# Patient Record
Sex: Male | Born: 2000 | Race: Black or African American | Hispanic: No | Marital: Single | Smoking: Never smoker
Health system: Southern US, Community
[De-identification: ages and names within clinical notes are randomized; demographics above are authoritative.]

---
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
## 2021-08-23 ENCOUNTER — Encounter (HOSPITAL_COMMUNITY): Payer: Self-pay

## 2021-08-23 ENCOUNTER — Ambulatory Visit (HOSPITAL_COMMUNITY)
Admission: EM | Admit: 2021-08-23 | Discharge: 2021-08-23 | Disposition: A | Discharge status: Home / Self Care | Payer: No Typology Code available for payment source | Attending: Student | Admitting: Student

## 2021-08-23 ENCOUNTER — Other Ambulatory Visit: Payer: Self-pay

## 2021-08-23 DIAGNOSIS — Z202 Contact with and (suspected) exposure to infections with a predominantly sexual mode of transmission: Secondary | ICD-10-CM | POA: Insufficient documentation

## 2021-08-23 DIAGNOSIS — Z113 Encounter for screening for infections with a predominantly sexual mode of transmission: Secondary | ICD-10-CM | POA: Diagnosis present

## 2021-08-23 MED ORDER — DOXYCYCLINE HYCLATE 100 MG PO CAPS: 100.0000 mg | ORAL_CAPSULE | Freq: Two times a day (BID) | ORAL | 0 refills | Status: AC

## 2021-08-23 NOTE — ED Triage Notes (Signed)
Pt presents for STD testing.   States he had a partner that tested positive for Chlamydia. States he has discharge and burning when urinating.

## 2021-08-23 NOTE — Discharge Instructions (Addendum)
-  Doxycycline twice daily for 7 days.  Make sure to wear sunscreen while spending time outside while on this medication as it can increase your chance of sunburn. You can take this medication with food if you have a sensitive stomach.  

## 2021-08-23 NOTE — ED Provider Notes (Signed)
That his [facility]    CSN: [id number] Arrival date & time: 08/23/21  [id number]      History   Chief Complaint Chief Complaint  Patient presents with   SEXUALLY TRANSMITTED DISEASE    HPI Lee Castro is a 20 y.o. male presenting with penile discharge following exposure to chlamydia.  Medical history noncontributory.  His male partner was recently told that her old partner had chlamydia, she got tested and she also has chlamydia.  Patient has been having unprotected sex with partner.  Describes white penile discharge and dysuria.  Denies penile or testicular pain or swelling, penile or testicular lesions, abdominal pain, flank pain, fever/chills.  HPI  History reviewed. No pertinent past medical history.  There are no problems to display for this patient.   History reviewed. No pertinent surgical history.     Home Medications    Prior to Admission medications   Medication Sig Start Date End Date Taking? Authorizing Provider  doxycycline (VIBRAMYCIN) 100 MG capsule Take 1 capsule (100 mg total) by mouth 2 (two) times daily for 7 days. 08/23/21 08/30/21 Yes Hazel Sams, PA-C  cephALEXin (KEFLEX) 500 MG capsule Take 1 capsule (500 mg total) by mouth 3 (three) times daily. For 10 days 03/17/17   Harlene Salts, MD  ibuprofen (ADVIL) 200 MG tablet Take by mouth.    [provider]  mupirocin ointment (BACTROBAN) 2 % Apply to affected rash bid for 10 days 03/17/17   Harlene Salts, MD  ondansetron (ZOFRAN) 4 MG tablet Take 1 tablet (4 mg total) by mouth every 6 (six) hours. 12/10/16   Hedges, Dellis Filbert, PA-C  trimethoprim-polymyxin b (POLYTRIM) ophthalmic solution Place 1 drop into the left eye every 6 (six) hours. For 5 days 03/17/17   Harlene Salts, MD    Family History Family History  Problem Relation Age of Onset   Healthy Mother    Healthy Father     Social History Social History   Tobacco Use   Smoking status: Never   Smokeless tobacco: Never   Substance Use Topics   Alcohol use: No   Drug use: No     Allergies   Other   Review of Systems Review of Systems  Constitutional:  Negative for chills and fever.  HENT:  Negative for sore throat.   Eyes:  Negative for pain and redness.  Respiratory:  Negative for shortness of breath.   Cardiovascular:  Negative for chest pain.  Gastrointestinal:  Negative for abdominal pain, diarrhea, nausea and vomiting.  Genitourinary:  Positive for dysuria and penile discharge. Negative for decreased urine volume, difficulty urinating, flank pain, frequency, genital sores, hematuria, penile pain, penile swelling, scrotal swelling, testicular pain and urgency.  Musculoskeletal:  Negative for back pain.  Skin:  Negative for rash.  All other systems reviewed and are negative.   Physical Exam Triage Vital Signs ED Triage Vitals  Enc Vitals Group     BP 08/23/21 0945 120/75     Pulse Rate 08/23/21 0944 (!) 57     Resp 08/23/21 0944 20     Temp 08/23/21 0944 98 F (36.7 C)     Temp Source 08/23/21 0944 Oral     SpO2 08/23/21 0944 99 %     Weight --      Height --      Head Circumference --      Peak Flow --      Pain Score 08/23/21 0942 0     Pain  Loc --      Pain Edu? --      Excl. in [place]? --    No data found.  Updated Vital Signs BP 120/75 (BP Location: Right Arm)   Pulse (!) 57   Temp 98 F (36.7 C) (Oral)   Resp 20   SpO2 99%   Visual Acuity Right Eye Distance:   Left Eye Distance:   Bilateral Distance:    Right Eye Near:   Left Eye Near:    Bilateral Near:     Physical Exam Vitals reviewed.  Constitutional:      General: He is not in acute distress.    Appearance: Normal appearance. He is not ill-appearing.  HENT:     Head: Normocephalic and atraumatic.     Mouth/Throat:     Mouth: Mucous membranes are moist.     Comments: Moist mucous membranes Eyes:     Extraocular Movements: Extraocular movements intact.     Pupils: Pupils are equal, round, and  reactive to light.  Cardiovascular:     Rate and Rhythm: Normal rate and regular rhythm.     Heart sounds: Normal heart sounds.  Pulmonary:     Effort: Pulmonary effort is normal.     Breath sounds: Normal breath sounds. No wheezing, rhonchi or rales.  Abdominal:     General: Bowel sounds are normal. There is no distension.     Palpations: Abdomen is soft. There is no mass.     Tenderness: There is no abdominal tenderness. There is no right CVA tenderness, left CVA tenderness, guarding or rebound.  Genitourinary:    Comments: deferred Skin:    General: Skin is warm.     Capillary Refill: Capillary refill takes less than 2 seconds.     Comments: Good skin turgor  Neurological:     General: No focal deficit present.     Mental Status: He is alert and oriented to person, place, and time.  Psychiatric:        Mood and Affect: Mood normal.        Behavior: Behavior normal.     UC Treatments / Results  Labs (all labs ordered are listed, but only abnormal results are displayed) Labs Reviewed  CYTOLOGY, (ORAL, ANAL, URETHRAL) ANCILLARY ONLY    EKG   Radiology No results found.  Procedures Procedures (including critical care time)  Medications Ordered in UC Medications - No data to display  Initial Impression / Assessment and Plan / UC Course  I have reviewed the triage vital signs and the nursing notes.  Pertinent labs & imaging results that were available during my care of the patient were reviewed by me and considered in my medical decision making (see chart for details).     This patient is a very pleasant 20 y.o. year old male presenting with penile discharge following exposure to chlamydia. Afebrile, nontachycardic, no reproducible abd pain or CVAT.  Will send self-swab for G/C, trich. Declines HIV, RPR. Safe sex precautions.   Doxycycline sent.   ED return precautions discussed. Patient verbalizes understanding and agreement.     Final Clinical  Impressions(s) / UC Diagnoses   Final diagnoses:  Exposure to chlamydia  Routine screening for STI (sexually transmitted infection)     Discharge Instructions      -Doxycycline twice daily for 7 days.  Make sure to wear sunscreen while spending time outside while on this medication as it can increase your chance of sunburn. You can take this medication  with food if you have a sensitive stomach.    ED Prescriptions     Medication Sig Dispense Auth. Provider   doxycycline (VIBRAMYCIN) 100 MG capsule Take 1 capsule (100 mg total) by mouth 2 (two) times daily for 7 days. 14 capsule Hazel Sams, PA-C      PDMP not reviewed this encounter.   Hazel Sams, PA-C 08/23/21 1028

## 2021-08-26 LAB — CYTOLOGY, (ORAL, ANAL, URETHRAL) ANCILLARY ONLY
Chlamydia: POSITIVE — AB
Comment: NEGATIVE
Comment: NEGATIVE
Comment: NORMAL
Neisseria Gonorrhea: NEGATIVE
Trichomonas: POSITIVE — AB

## 2021-08-27 ENCOUNTER — Telehealth (HOSPITAL_COMMUNITY): Payer: Self-pay | Admitting: Emergency Medicine

## 2021-08-27 MED ORDER — METRONIDAZOLE 500 MG PO TABS: 2000.0000 mg | ORAL_TABLET | Freq: Once | ORAL | 0 refills | Status: AC

## 2021-08-30 ENCOUNTER — Telehealth (HOSPITAL_COMMUNITY): Payer: Self-pay | Admitting: Emergency Medicine

## 2021-08-30 MED ORDER — METRONIDAZOLE 500 MG PO TABS: 2000.0000 mg | ORAL_TABLET | Freq: Once | ORAL | 0 refills | Status: AC

## 2021-08-30 NOTE — Telephone Encounter (Signed)
Patient wanted [facility] and not [facility], resent prescription

## 2022-02-27 ENCOUNTER — Ambulatory Visit (HOSPITAL_COMMUNITY)
Admission: EM | Admit: 2022-02-27 | Discharge: 2022-02-27 | Disposition: A | Discharge status: Home / Self Care | Payer: No Typology Code available for payment source | Attending: Physician Assistant | Admitting: Physician Assistant

## 2022-02-27 ENCOUNTER — Encounter (HOSPITAL_COMMUNITY): Payer: Self-pay

## 2022-02-27 DIAGNOSIS — R5383 Other fatigue: Secondary | ICD-10-CM | POA: Insufficient documentation

## 2022-02-27 DIAGNOSIS — R5381 Other malaise: Secondary | ICD-10-CM

## 2022-02-27 DIAGNOSIS — Z20822 Contact with and (suspected) exposure to covid-19: Secondary | ICD-10-CM | POA: Diagnosis not present

## 2022-02-27 DIAGNOSIS — J069 Acute upper respiratory infection, unspecified: Secondary | ICD-10-CM | POA: Diagnosis not present

## 2022-02-27 DIAGNOSIS — J029 Acute pharyngitis, unspecified: Secondary | ICD-10-CM | POA: Diagnosis present

## 2022-02-27 DIAGNOSIS — R051 Acute cough: Secondary | ICD-10-CM | POA: Diagnosis not present

## 2022-02-27 LAB — POCT RAPID STREP A, ED / UC: Streptococcus, Group A Screen (Direct): NEGATIVE

## 2022-02-27 MED ORDER — PROMETHAZINE-DM 6.25-15 MG/5ML PO SYRP: 5.0000 mL | ORAL_SOLUTION | Freq: Two times a day (BID) | ORAL | 0 refills | Status: DC | PRN

## 2022-02-27 NOTE — ED Triage Notes (Signed)
2 day h/o weakness, HA, dizziness and sore throat. ?Pt requesting covid test. No v/d. ?

## 2022-02-27 NOTE — ED Provider Notes (Signed)
?[facility] ? ? ? ?CSN: [id number] ?Arrival date & time: 02/27/22  1621 ? ? ?  ? ?History   ?Chief Complaint ?Chief Complaint  ?Patient presents with  ? Weakness  ? ? ?HPI ?Lee Castro is a 21 y.o. male.  ? ?Patient presents today with a 2 to 3-day history of URI symptoms.  He reports sore throat, headache, fatigue, malaise, generalized weakness, nausea.  Denies any vomiting, abdominal pain, chest pain, shortness of breath.  He denies any known sick contacts.  He has not had COVID in the past.  He has not had a COVID-vaccine.  He is drinking plenty of fluid.  He denies any recent antibiotic use.  He denies any significant past medical history including diabetes, asthma, COPD, smoking, allergies, immunosuppression.  He has not tried any over-the-counter medication for symptom management. ? ? ?History reviewed. No pertinent past medical history. ? ?There are no problems to display for this patient. ? ? ?History reviewed. No pertinent surgical history. ? ? ? ? ?Home Medications   ? ?Prior to Admission medications   ?Medication Sig Start Date End Date Taking? Authorizing Provider  ?promethazine-dextromethorphan (PROMETHAZINE-DM) 6.25-15 MG/5ML syrup Take 5 mLs by mouth 2 (two) times daily as needed for cough. 02/27/22  Yes Timmy Cleverly K, PA-C  ?ibuprofen (ADVIL) 200 MG tablet Take by mouth.    [provider]  ? ? ?Family History ?Family History  ?Problem Relation Age of Onset  ? Healthy Mother   ? Healthy Father   ? ? ?Social History ?Social History  ? ?Tobacco Use  ? Smoking status: Never  ? Smokeless tobacco: Never  ?Substance Use Topics  ? Alcohol use: No  ? Drug use: No  ? ? ? ?Allergies   ?Other ? ? ?Review of Systems ?Review of Systems  ?Constitutional:  Positive for activity change and fatigue. Negative for appetite change and fever.  ?HENT:  Positive for sore throat. Negative for congestion, sinus pressure and sneezing.   ?Respiratory:  Positive for cough. Negative for shortness of  breath.   ?Cardiovascular:  Negative for chest pain.  ?Gastrointestinal:  Positive for nausea. Negative for abdominal pain, diarrhea and vomiting.  ?Neurological:  Positive for weakness (Generalized) and headaches. Negative for dizziness and light-headedness.  ? ? ?Physical Exam ?Triage Vital Signs ?ED Triage Vitals [02/27/22 1654]  ?Enc Vitals Group  ?   BP 123/74  ?   Pulse Rate 77  ?   Resp 18  ?   Temp 98.7 ?F (37.1 ?C)  ?   Temp Source Oral  ?   SpO2 100 %  ?   Weight   ?   Height   ?   Head Circumference   ?   Peak Flow   ?   Pain Score 6  ?   Pain Loc   ?   Pain Edu?   ?   Excl. in [place]?   ? ?No data found. ? ?Updated Vital Signs ?BP 123/74 (BP Location: Left Arm)   Pulse 77   Temp 98.7 ?F (37.1 ?C) (Oral)   Resp 18   SpO2 100%  ? ?Visual Acuity ?Right Eye Distance:   ?Left Eye Distance:   ?Bilateral Distance:   ? ?Right Eye Near:   ?Left Eye Near:    ?Bilateral Near:    ? ?Physical Exam ?Vitals reviewed.  ?Constitutional:   ?   General: He is awake.  ?   Appearance: Normal appearance. He is well-developed. He is  not ill-appearing.  ?   Comments: Very pleasant male appears stated age in no acute distress sitting comfortably on exam room table  ?HENT:  ?   Head: Normocephalic and atraumatic.  ?   Right Ear: Tympanic membrane, ear canal and external ear normal. Tympanic membrane is not erythematous or bulging.  ?   Left Ear: Tympanic membrane, ear canal and external ear normal. Tympanic membrane is not erythematous or bulging.  ?   Nose: Nose normal.  ?   Mouth/Throat:  ?   Pharynx: Uvula midline. Posterior oropharyngeal erythema present. No oropharyngeal exudate or uvula swelling.  ?   Tonsils: No tonsillar exudate or tonsillar abscesses. 1+ on the right. 1+ on the left.  ?Cardiovascular:  ?   Rate and Rhythm: Normal rate and regular rhythm.  ?   Heart sounds: Normal heart sounds, S1 normal and S2 normal. No murmur heard. ?Pulmonary:  ?   Effort: Pulmonary effort is normal. No accessory muscle usage or  respiratory distress.  ?   Breath sounds: Normal breath sounds. No stridor. No wheezing, rhonchi or rales.  ?   Comments: Clear to auscultation bilaterally ?Neurological:  ?   Mental Status: He is alert.  ?Psychiatric:     ?   Behavior: Behavior is cooperative.  ? ? ? ?UC Treatments / Results  ?Labs ?(all labs ordered are listed, but only abnormal results are displayed) ?Labs Reviewed  ?SARS CORONAVIRUS 2 (TAT 6-24 HRS)  ?CULTURE, GROUP A STREP [facility])  ?POCT RAPID STREP A, ED / UC  ? ? ?EKG ? ? ?Radiology ?No results found. ? ?Procedures ?Procedures (including critical care time) ? ?Medications Ordered in UC ?Medications - No data to display ? ?Initial Impression / Assessment and Plan / UC Course  ?I have reviewed the triage vital signs and the nursing notes. ? ?Pertinent labs & imaging results that were available during my care of the patient were reviewed by me and considered in my medical decision making (see chart for details). ? ?  ? ?Patient is well-appearing, afebrile, nontoxic, nontachycardic.  Strep testing was obtained which was negative.  Flu testing was deferred as patient has been symptomatic for several days and is outside the window of effectiveness for Tamiflu.  COVID test was obtained and is pending.  He was provided work excuse note with current CDC return to work guidelines based on COVID test result.  Recommended over-the-counter medications including Tylenol/ibuprofen for pain and fever as well as Mucinex/Flonase for cough and congestion.  He was given Promethazine DM for cough with instruction not to drive or drink alcohol taking this medication as drowsiness is a common side effect.  He is to rest and drink plenty of fluid.  Discussed that if symptoms or not improving within a few days he should be reevaluated.  If at any point anything worsens and he has high fever, chest pain, shortness of breath, nausea/vomiting interfere with oral intake, weakness he needs to be seen immediately to which  she expressed understanding. ? ?Final Clinical Impressions(s) / UC Diagnoses  ? ?Final diagnoses:  ?Sore throat  ?Malaise and fatigue  ?Upper respiratory tract infection, unspecified type  ?Acute cough  ? ? ? ?Discharge Instructions   ? ?  ?Your strep test was negative.  I suspect that you have a virus.  Please use over-the-counter medications including Tylenol ibuprofen for fever and pain as well as Mucinex and Flonase for congestion.  I have called in Promethazine DM for cough.  This will  make you sleepy so do not drive or drink alcohol while taking it.  Make sure you rest and drink plenty of fluid.  We will contact you with your COVID results.  If you have any worsening symptoms including fever, worsening fatigue/weakness, chest pain, shortness of breath, worsening cough, nausea/vomiting interfere with oral intake need to be seen immediately. ? ? ? ? ?ED Prescriptions   ? ? Medication Sig Dispense Auth. Provider  ? promethazine-dextromethorphan (PROMETHAZINE-DM) 6.25-15 MG/5ML syrup Take 5 mLs by mouth 2 (two) times daily as needed for cough. 118 mL Siyana Erney K, PA-C  ? ?  ? ?PDMP not reviewed this encounter. ?  ?Jeani HawkingRaspet, Mosella Kasa K, PA-C ?02/27/22 1737 ? ?

## 2022-02-27 NOTE — Discharge Instructions (Signed)
Your strep test was negative.  I suspect that you have a virus.  Please use over-the-counter medications including Tylenol ibuprofen for fever and pain as well as Mucinex and Flonase for congestion.  I have called in Promethazine DM for cough.  This will make you sleepy so do not drive or drink alcohol while taking it.  Make sure you rest and drink plenty of fluid.  We will contact you with your COVID results.  If you have any worsening symptoms including fever, worsening fatigue/weakness, chest pain, shortness of breath, worsening cough, nausea/vomiting interfere with oral intake need to be seen immediately. ?

## 2022-02-28 LAB — SARS CORONAVIRUS 2 (TAT 6-24 HRS): SARS Coronavirus 2: NEGATIVE

## 2022-03-01 ENCOUNTER — Encounter (HOSPITAL_BASED_OUTPATIENT_CLINIC_OR_DEPARTMENT_OTHER): Payer: Self-pay | Admitting: Emergency Medicine

## 2022-03-01 ENCOUNTER — Emergency Department (HOSPITAL_BASED_OUTPATIENT_CLINIC_OR_DEPARTMENT_OTHER)
Admission: EM | Admit: 2022-03-01 | Discharge: 2022-03-01 | Disposition: A | Discharge status: Home / Self Care | Payer: No Typology Code available for payment source | Attending: Emergency Medicine | Admitting: Emergency Medicine

## 2022-03-01 ENCOUNTER — Emergency Department (HOSPITAL_BASED_OUTPATIENT_CLINIC_OR_DEPARTMENT_OTHER): Payer: No Typology Code available for payment source | Admitting: Radiology

## 2022-03-01 ENCOUNTER — Other Ambulatory Visit: Payer: Self-pay

## 2022-03-01 ENCOUNTER — Emergency Department (HOSPITAL_BASED_OUTPATIENT_CLINIC_OR_DEPARTMENT_OTHER): Payer: No Typology Code available for payment source

## 2022-03-01 DIAGNOSIS — S060X0A Concussion without loss of consciousness, initial encounter: Secondary | ICD-10-CM | POA: Diagnosis not present

## 2022-03-01 DIAGNOSIS — M542 Cervicalgia: Secondary | ICD-10-CM | POA: Insufficient documentation

## 2022-03-01 DIAGNOSIS — Y92410 Unspecified street and highway as the place of occurrence of the external cause: Secondary | ICD-10-CM | POA: Insufficient documentation

## 2022-03-01 DIAGNOSIS — M545 Low back pain, unspecified: Secondary | ICD-10-CM | POA: Diagnosis not present

## 2022-03-01 DIAGNOSIS — S060X9A Concussion with loss of consciousness of unspecified duration, initial encounter: Secondary | ICD-10-CM

## 2022-03-01 DIAGNOSIS — S0990XA Unspecified injury of head, initial encounter: Secondary | ICD-10-CM | POA: Diagnosis present

## 2022-03-01 DIAGNOSIS — M25531 Pain in right wrist: Secondary | ICD-10-CM | POA: Diagnosis not present

## 2022-03-01 MED ORDER — METHOCARBAMOL 500 MG PO TABS
500.0000 mg | ORAL_TABLET | Freq: Once | ORAL | Status: AC
  Administered 2022-03-01: 500 mg via ORAL
  Filled 2022-03-01: qty 1

## 2022-03-01 MED ORDER — NAPROXEN 500 MG PO TABS: 500.0000 mg | ORAL_TABLET | Freq: Two times a day (BID) | ORAL | 0 refills | Status: DC

## 2022-03-01 MED ORDER — ONDANSETRON 4 MG PO TBDP
4.0000 mg | ORAL_TABLET | Freq: Once | ORAL | Status: AC
  Administered 2022-03-01: 4 mg via ORAL
  Filled 2022-03-01: qty 1

## 2022-03-01 MED ORDER — ONDANSETRON 4 MG PO TBDP: 4.0000 mg | ORAL_TABLET | Freq: Three times a day (TID) | ORAL | 0 refills | Status: DC | PRN

## 2022-03-01 MED ORDER — METHOCARBAMOL 500 MG PO TABS: 500.0000 mg | ORAL_TABLET | Freq: Every evening | ORAL | 0 refills | Status: AC
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
## 2022-03-01 NOTE — ED Notes (Signed)
Patient transported to CT via w/c escorted by CT tech - awake and alert; no acute distress  ?

## 2022-03-01 NOTE — ED Notes (Signed)
Pt now returned from imaging via w/c - remains awake and alert; no acute changes noted.  ?

## 2022-03-01 NOTE — Discharge Instructions (Addendum)
You have been provided the contact information for Dr. Shon Baton, the orthopedic specialist.  Please schedule an appoint within the next 3 to 5 days for follow-up and reevaluation of your back, neck, and right wrist. ? ?Prescriptions have been sent to your pharmacy, they are as follows: ?Naproxen-an anti-inflammatory pain medication.  You may take 1 tablet every 12 hours as needed.  Always take with plenty of food and water ?Robaxin-a muscle relaxant.  You may take 1 tablet before you go to bed.  Do not drive or operate machinery after taking this medication.  If you find this medication makes you more drowsy than you prefer, you may stop taking this medicaiton. ?Zofran-an antinausea medication.  You may take 1 tablet and let it dissolve under your tongue as needed every 8 hours for nausea relief. ? ?You have also been provided the contact information for a neurologist.  If you continue to have concussion type symptoms for longer than 3 to 4 weeks, please schedule a follow-up for evaluation for postconcussive syndrome. ? ?Return to the ED for new or worsening symptoms as discussed. ?

## 2022-03-01 NOTE — ED Notes (Signed)
Pt agreeable with d/c plan - this nurse has verbally reinforced d/c instructions and provided pt with written copy- pt acknowledges verbal understanding and denies any additional questions, concerns, needs- pt declined w/c - ambulatory at d/c with steady gait accompanied by family member.   ?
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
## 2022-03-02 LAB — CULTURE, GROUP A STREP (THRC)

## 2022-03-14 ENCOUNTER — Ambulatory Visit: Payer: No Typology Code available for payment source | Admitting: Neurology

## 2022-04-03 ENCOUNTER — Encounter: Payer: Self-pay | Admitting: Neurology

## 2022-04-03 ENCOUNTER — Ambulatory Visit (INDEPENDENT_AMBULATORY_CARE_PROVIDER_SITE_OTHER): Payer: BC Managed Care – PPO | Admitting: Neurology

## 2022-04-03 VITALS — BP 106/70 | HR 64 | Ht 71.0 in | Wt 144.0 lb

## 2022-04-03 DIAGNOSIS — S63501A Unspecified sprain of right wrist, initial encounter: Secondary | ICD-10-CM | POA: Diagnosis not present

## 2022-04-03 DIAGNOSIS — F0781 Postconcussional syndrome: Secondary | ICD-10-CM | POA: Diagnosis not present

## 2022-04-03 DIAGNOSIS — S060XAA Concussion with loss of consciousness status unknown, initial encounter: Secondary | ICD-10-CM

## 2022-04-03 NOTE — Patient Instructions (Signed)
Continue with ibuprofen and Tylenol as needed for headaches Get plenty of rest Return as needed
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
## 2022-12-29 ENCOUNTER — Ambulatory Visit (HOSPITAL_COMMUNITY)
Admission: EM | Admit: 2022-12-29 | Discharge: 2022-12-29 | Disposition: A | Discharge status: Home / Self Care | Payer: No Typology Code available for payment source | Attending: Internal Medicine | Admitting: Internal Medicine

## 2022-12-29 ENCOUNTER — Encounter (HOSPITAL_COMMUNITY): Payer: Self-pay

## 2022-12-29 DIAGNOSIS — Z202 Contact with and (suspected) exposure to infections with a predominantly sexual mode of transmission: Secondary | ICD-10-CM | POA: Insufficient documentation

## 2022-12-29 MED ORDER — DOXYCYCLINE HYCLATE 100 MG PO CAPS: 100.0000 mg | ORAL_CAPSULE | Freq: Two times a day (BID) | ORAL | 0 refills | Status: AC

## 2022-12-29 NOTE — ED Provider Notes (Signed)
[facility]    CSN: [id number] Arrival date & time: 12/29/22  [id number]      History   Chief Complaint Chief Complaint  Patient presents with   Exposure to STD    HPI Lee Castro is a 22 y.o. male comes to the urgent care for after exposure to chlamydia.  Patient sexual partner tested positive for chlamydia.  He is engaged in sexual intercourse with his sexual partner.  The condom broke during sexual intercourse.  3 days after the sexual intercourse patient started experiencing some discomfort with urination.  No urethral discharge.  No groin pain.  No scrotal swelling or pain.  No testicular pain or swelling.  No abdominal pain.Marland Kitchen   HPI  History reviewed. No pertinent past medical history.  Patient Active Problem List   Diagnosis Date Noted   Closed fracture of left distal radius 11/26/2017    History reviewed. No pertinent surgical history.     Home Medications    Prior to Admission medications   Medication Sig Start Date End Date Taking? Authorizing Provider  doxycycline (VIBRAMYCIN) 100 MG capsule Take 1 capsule (100 mg total) by mouth 2 (two) times daily for 7 days. 12/29/22 01/05/23 Yes Zadia Uhde, Myrene Galas, MD    Family History Family History  Problem Relation Age of Onset   Healthy Mother    Healthy Father     Social History Social History   Tobacco Use   Smoking status: Never   Smokeless tobacco: Never  Vaping Use   Vaping Use: Never used  Substance Use Topics   Alcohol use: No   Drug use: No     Allergies   Patient has no known allergies.   Review of Systems Review of Systems As per HPI  Physical Exam Triage Vital Signs ED Triage Vitals  Enc Vitals Group     BP 12/29/22 1641 136/73     Pulse Rate 12/29/22 1641 81     Resp 12/29/22 1641 18     Temp 12/29/22 1641 98.6 F (37 C)     Temp Source 12/29/22 1641 Oral     SpO2 --      Weight --      Height --      Head Circumference --      Peak Flow --      Pain Score  12/29/22 1642 0     Pain Loc --      Pain Edu? --      Excl. in [place]? --    No data found.  Updated Vital Signs BP 136/73 (BP Location: Left Arm)   Pulse 81   Temp 98.6 F (37 C) (Oral)   Resp 18   Visual Acuity Right Eye Distance:   Left Eye Distance:   Bilateral Distance:    Right Eye Near:   Left Eye Near:    Bilateral Near:     Physical Exam Vitals and nursing note reviewed.  Constitutional:      Appearance: Normal appearance.  Cardiovascular:     Rate and Rhythm: Normal rate and regular rhythm.     Pulses: Normal pulses.     Heart sounds: Normal heart sounds.  Pulmonary:     Effort: Pulmonary effort is normal.     Breath sounds: Normal breath sounds.  Musculoskeletal:        General: Normal range of motion.  Neurological:     Mental Status: He is alert.      UC Treatments /  Results  Labs (all labs ordered are listed, but only abnormal results are displayed) Labs Reviewed  CYTOLOGY, (ORAL, ANAL, URETHRAL) ANCILLARY ONLY    EKG   Radiology No results found.  Procedures Procedures (including critical care time)  Medications Ordered in UC Medications - No data to display  Initial Impression / Assessment and Plan / UC Course  I have reviewed the triage vital signs and the nursing notes.  Pertinent labs & imaging results that were available during my care of the patient were reviewed by me and considered in my medical decision making (see chart for details).     1.  Exposure to chlamydia: Cytology for GC/chlamydia/trichomonas Doxycycline 100 mg twice daily for 7 days Safe sex practices recommended Return precautions recommended  Final Clinical Impressions(s) / UC Diagnoses   Final diagnoses:  Exposure to chlamydia     Discharge Instructions      Please abstain from sexual intercourse until lab results are available Safe sex practices advised Will call you with recommendations if labs are abnormal Return to urgent care if symptoms  worsen.   ED Prescriptions     Medication Sig Dispense Auth. Provider   doxycycline (VIBRAMYCIN) 100 MG capsule Take 1 capsule (100 mg total) by mouth 2 (two) times daily for 7 days. 14 capsule Bowe Sidor, Myrene Galas, MD      PDMP not reviewed this encounter.   Chase Picket, MD 12/29/22 [phone]

## 2022-12-29 NOTE — Discharge Instructions (Addendum)
Please abstain from sexual intercourse until lab results are available Safe sex practices advised Will call you with recommendations if labs are abnormal Return to urgent care if symptoms worsen.

## 2022-12-29 NOTE — ED Triage Notes (Signed)
Pt states his partner is positive for chlamydia. States his condom broke during intercourse and had some burning on urination 3 days later.

## 2022-12-30 LAB — CYTOLOGY, (ORAL, ANAL, URETHRAL) ANCILLARY ONLY
Chlamydia: POSITIVE — AB
Comment: NEGATIVE
Comment: NEGATIVE
Comment: NORMAL
Neisseria Gonorrhea: NEGATIVE
Trichomonas: POSITIVE — AB

## 2022-12-31 ENCOUNTER — Telehealth (HOSPITAL_COMMUNITY): Payer: Self-pay | Admitting: Emergency Medicine

## 2022-12-31 MED ORDER — METRONIDAZOLE 500 MG PO TABS: 2000.0000 mg | ORAL_TABLET | Freq: Once | ORAL | 0 refills | Status: AC

## 2023-01-01 ENCOUNTER — Telehealth (HOSPITAL_COMMUNITY): Payer: Self-pay | Admitting: Emergency Medicine

## 2023-01-01 NOTE — Telephone Encounter (Signed)
Attempted to reach patient x 2 to review recent results, LVM

## 2023-01-02 NOTE — Telephone Encounter (Signed)
Contacted patient by phone.  Verified identity using two identifiers.  Provided positive result.  Reviewed safe sex practices, notifying partners, and refraining from sexual activities for 7 days from time of treatment.  Patient verified understanding, all questions answered.   Verfiied phrmacy

## 2023-02-13 ENCOUNTER — Ambulatory Visit (HOSPITAL_COMMUNITY)
Admission: EM | Admit: 2023-02-13 | Discharge: 2023-02-13 | Disposition: A | Discharge status: Home / Self Care | Payer: No Typology Code available for payment source | Attending: Internal Medicine | Admitting: Internal Medicine

## 2023-02-13 ENCOUNTER — Encounter (HOSPITAL_COMMUNITY): Payer: Self-pay

## 2023-02-13 DIAGNOSIS — A084 Viral intestinal infection, unspecified: Secondary | ICD-10-CM | POA: Diagnosis not present

## 2023-02-13 MED ORDER — ONDANSETRON 4 MG PO TBDP
4.0000 mg | ORAL_TABLET | Freq: Once | ORAL | Status: AC
  Administered 2023-02-13: 4 mg via ORAL

## 2023-02-13 MED ORDER — ONDANSETRON 4 MG PO TBDP: 4.0000 mg | ORAL_TABLET | Freq: Three times a day (TID) | ORAL | 0 refills | Status: AC | PRN

## 2023-02-13 MED ORDER — ONDANSETRON 4 MG PO TBDP
ORAL_TABLET | ORAL | Status: AC
Start: 2023-02-13 — End: ?
  Filled 2023-02-13: qty 1

## 2023-02-13 NOTE — Discharge Instructions (Signed)

## 2023-02-13 NOTE — ED Provider Notes (Signed)
[facility]    CSN: [id number] Arrival date & time: 02/13/23  1447      History   Chief Complaint Chief Complaint  Patient presents with   Abdominal Pain   Emesis   Diarrhea    HPI Lee Castro is a 22 y.o. male.   Patient presents to urgent care for evaluation of nausea, vomiting, diarrhea, and abdominal pain that started yesterday after he ate a hamburger from [facility].  He has eaten [employer] in the past without symptoms.  Abdominal pain is generalized and currently mild.  He was able to go to sleep last night and keep food and fluids down but then had another episode of emesis at work.  Emesis is nonbloody/nonbilious.  No blood or mucus in the stools.  No headache, recent viral URI symptoms, recent antibiotic or steroid use, or urinary symptoms.  No history of surgical procedures to the abdomen.  He has not attempted use of any over-the-counter medications to help with symptoms before coming to urgent care.   Abdominal Pain Associated symptoms: diarrhea and vomiting   Emesis Associated symptoms: abdominal pain and diarrhea   Diarrhea Associated symptoms: abdominal pain and vomiting     History reviewed. No pertinent past medical history.  Patient Active Problem List   Diagnosis Date Noted   Closed fracture of left distal radius 11/26/2017    History reviewed. No pertinent surgical history.     Home Medications    Prior to Admission medications   Medication Sig Start Date End Date Taking? Authorizing Provider  ondansetron (ZOFRAN-ODT) 4 MG disintegrating tablet Take 1 tablet (4 mg total) by mouth every 8 (eight) hours as needed for nausea or vomiting. 02/13/23  Yes Ezzard Ditmer, Donavan Burnet, FNP    Family History Family History  Problem Relation Age of Onset   Healthy Mother    Healthy Father     Social History Social History   Tobacco Use   Smoking status: Never   Smokeless tobacco: Never  Vaping Use   Vaping Use: Never used  Substance Use  Topics   Alcohol use: No   Drug use: No     Allergies   Patient has no known allergies.   Review of Systems Review of Systems  Gastrointestinal:  Positive for abdominal pain, diarrhea and vomiting.  Per HPI   Physical Exam Triage Vital Signs ED Triage Vitals  Enc Vitals Group     BP 02/13/23 1552 (!) 156/88     Pulse Rate 02/13/23 1552 60     Resp 02/13/23 1552 16     Temp 02/13/23 1552 98.7 F (37.1 C)     Temp Source 02/13/23 1552 Oral     SpO2 02/13/23 1552 96 %     Weight --      Height --      Head Circumference --      Peak Flow --      Pain Score 02/13/23 1553 6     Pain Loc --      Pain Edu? --      Excl. in GC? --    No data found.  Updated Vital Signs BP (!) 156/88 (BP Location: Right Arm)   Pulse 60   Temp 98.7 F (37.1 C) (Oral)   Resp 16   SpO2 96%   Visual Acuity Right Eye Distance:   Left Eye Distance:   Bilateral Distance:    Right Eye Near:   Left Eye Near:    Bilateral  Near:     Physical Exam Vitals and nursing note reviewed.  Constitutional:      Appearance: He is not ill-appearing or toxic-appearing.  HENT:     Head: Normocephalic and atraumatic.     Right Ear: Hearing and external ear normal.     Left Ear: Hearing and external ear normal.     Nose: Nose normal.     Mouth/Throat:     Lips: Pink.  Eyes:     General: Lids are normal. Vision grossly intact. Gaze aligned appropriately.     Extraocular Movements: Extraocular movements intact.     Conjunctiva/sclera: Conjunctivae normal.  Cardiovascular:     Rate and Rhythm: Normal rate and regular rhythm.     Heart sounds: Normal heart sounds, S1 normal and S2 normal.  Pulmonary:     Effort: Pulmonary effort is normal. No respiratory distress.     Breath sounds: Normal breath sounds and air entry.  Abdominal:     General: Abdomen is flat. Bowel sounds are normal. There is no distension. There are no signs of injury.     Palpations: Abdomen is soft.     Tenderness: There is  generalized abdominal tenderness. There is no right CVA tenderness, left CVA tenderness or guarding.     Hernia: No hernia is present.  Musculoskeletal:     Cervical back: Neck supple.  Skin:    General: Skin is warm and dry.     Capillary Refill: Capillary refill takes less than 2 seconds.     Findings: No rash.  Neurological:     General: No focal deficit present.     Mental Status: He is alert and oriented to person, place, and time. Mental status is at baseline.     Cranial Nerves: No dysarthria or facial asymmetry.  Psychiatric:        Mood and Affect: Mood normal.        Speech: Speech normal.        Behavior: Behavior normal.        Thought Content: Thought content normal.        Judgment: Judgment normal.      UC Treatments / Results  Labs (all labs ordered are listed, but only abnormal results are displayed) Labs Reviewed - No data to display  EKG   Radiology No results found.  Procedures Procedures (including critical care time)  Medications Ordered in UC Medications  ondansetron (ZOFRAN-ODT) disintegrating tablet 4 mg (4 mg Oral Given 02/13/23 1625)    Initial Impression / Assessment and Plan / UC Course  I have reviewed the triage vital signs and the nursing notes.  Pertinent labs & imaging results that were available during my care of the patient were reviewed by me and considered in my medical decision making (see chart for details).   1.  Viral gastroenteritis Presentation is consistent with acute viral gastroenteritis that will likely improve with rest, increased fluid intake, and as needed use of antiemetics. Abdominal exam is without peritoneal signs, patient is nontoxic in appearance, and vitals are hemodynamically stable. Push fluids with water and pedialyte for rehydration.  Zofran given in clinic for nausea and vomiting. May use zofran 4mg  ODT every 8 hours as needed for nausea and vomiting. May use Tylenol for abdominal discomfort at home related  to viral illness. Bland/liquid diet recommended for 12-24 hours, then increase diet to normal as tolerated.   Discussed physical exam and available lab work findings in clinic with patient.  Counseled patient  regarding appropriate use of medications and potential side effects for all medications recommended or prescribed today. Discussed red flag signs and symptoms of worsening condition,when to call the PCP office, return to urgent care, and when to seek higher level of care in the emergency department. Patient verbalizes understanding and agreement with plan. All questions answered. Patient discharged in stable condition.    Final Clinical Impressions(s) / UC Diagnoses   Final diagnoses:  Viral gastroenteritis     Discharge Instructions      Your evaluation suggests that your symptoms are most likely due to viral stomach illness (gastroenteritis) which will improve on its own with rest and fluids in the next few days.   I have prescribed an antinausea medication for you to take at home called Zofran. It is the same medication that we gave you in the office.  You may use tylenol 1,000mg  every 6 hours over the counter as needed for abdominal discomfort related to this virus.   Eat a bland diet for the next 12-24 hours (bananas, rice, white toast, and applesauce) once you are able to tolerate liquids (broth, etc). These foods are easy for your stomach to digest. Pedialyte can be purchased to help with rehydration. Drink plenty of water.   Please follow up with your primary care provider for further management. Return if you experience worsening or uncontrolled pain, inability to tolerate fluids by mouth, difficulty breathing, fevers 100.81F or greater, recurrent vomiting, or any other concerning symptoms. I hope you feel better!     ED Prescriptions     Medication Sig Dispense Auth. Provider   ondansetron (ZOFRAN-ODT) 4 MG disintegrating tablet Take 1 tablet (4 mg total) by mouth every 8  (eight) hours as needed for nausea or vomiting. 20 tablet Carlisle Beers, FNP      PDMP not reviewed this encounter.   Carlisle Beers, [place] 02/13/23 1644

## 2023-02-13 NOTE — ED Triage Notes (Signed)
Patient reports that he began having emesis, upper abdominal pain, and diarrhea after eating [employer] yesterday.  Patient has not had any medications for his symptoms.

## 2023-02-27 ENCOUNTER — Encounter (HOSPITAL_COMMUNITY): Payer: Self-pay

## 2023-02-27 ENCOUNTER — Ambulatory Visit (HOSPITAL_COMMUNITY)
Admission: EM | Admit: 2023-02-27 | Discharge: 2023-02-27 | Disposition: A | Discharge status: Home / Self Care | Payer: No Typology Code available for payment source | Attending: Family Medicine | Admitting: Family Medicine

## 2023-02-27 ENCOUNTER — Telehealth (HOSPITAL_COMMUNITY): Payer: Self-pay | Admitting: Family Medicine

## 2023-02-27 DIAGNOSIS — J069 Acute upper respiratory infection, unspecified: Secondary | ICD-10-CM | POA: Insufficient documentation

## 2023-02-27 DIAGNOSIS — Z20822 Contact with and (suspected) exposure to covid-19: Secondary | ICD-10-CM | POA: Insufficient documentation

## 2023-02-27 MED ORDER — BENZONATATE 100 MG PO CAPS: 100.0000 mg | ORAL_CAPSULE | Freq: Three times a day (TID) | ORAL | 0 refills | Status: AC | PRN

## 2023-02-27 NOTE — ED Provider Notes (Signed)
[facility]    CSN: [id number] Arrival date & time: 02/27/23  1227      History   Chief Complaint Chief Complaint  Patient presents with   Cough   Fatigue    HPI Lee Castro is a 22 y.o. male.   HPI Patient with no pertinent medical history presents today for evaluation of cough, fatigue, nasal congestion after being exposed to COVID by his girlfriend.  Patient denies any known fever although endorses hot sweats and some chills.  He endorses some mild shortness of breath but denies any chest tightness.  Has no history of asthma.  He has not taken any medication for symptoms.   History reviewed. No pertinent past medical history.  Patient Active Problem List   Diagnosis Date Noted   Closed fracture of left distal radius 11/26/2017    History reviewed. No pertinent surgical history.     Home Medications    Prior to Admission medications   Medication Sig Start Date End Date Taking? Authorizing Provider  benzonatate (TESSALON) 100 MG capsule Take 1-2 capsules (100-200 mg total) by mouth 3 (three) times daily as needed for cough. 02/27/23  Yes Bing Neighbors, NP  ondansetron (ZOFRAN-ODT) 4 MG disintegrating tablet Take 1 tablet (4 mg total) by mouth every 8 (eight) hours as needed for nausea or vomiting. 02/13/23   Carlisle Beers, FNP    Family History Family History  Problem Relation Age of Onset   Healthy Mother    Healthy Father     Social History Social History   Tobacco Use   Smoking status: Never   Smokeless tobacco: Never  Vaping Use   Vaping Use: Never used  Substance Use Topics   Alcohol use: No   Drug use: No     Allergies   Patient has no known allergies.   Review of Systems Review of Systems Pertinent negatives listed in HPI   Physical Exam Triage Vital Signs ED Triage Vitals [02/27/23 1246]  Enc Vitals Group     BP 132/88     Pulse Rate 90     Resp 18     Temp 98 F (36.7 C)     Temp Source Oral      SpO2 98 %     Weight      Height      Head Circumference      Peak Flow      Pain Score 7     Pain Loc      Pain Edu?      Excl. in GC?    No data found.  Updated Vital Signs BP 132/88 (BP Location: Left Arm)   Pulse 90   Temp 98 F (36.7 C) (Oral)   Resp 18   SpO2 98%   Visual Acuity Right Eye Distance:   Left Eye Distance:   Bilateral Distance:    Right Eye Near:   Left Eye Near:    Bilateral Near:     Physical Exam Vitals reviewed.  Constitutional:      Appearance: Normal appearance.  HENT:     Nose: Congestion and rhinorrhea present.  Eyes:     Extraocular Movements: Extraocular movements intact.     Pupils: Pupils are equal, round, and reactive to light.  Cardiovascular:     Rate and Rhythm: Normal rate and regular rhythm.  Pulmonary:     Effort: Pulmonary effort is normal.     Breath sounds: Normal breath sounds.  Musculoskeletal:  Cervical back: Normal range of motion.  Skin:    General: Skin is warm.  Neurological:     General: No focal deficit present.     Mental Status: He is alert.      UC Treatments / Results  Labs (all labs ordered are listed, but only abnormal results are displayed) Labs Reviewed  NOVEL CORONAVIRUS, NAA (HOSP ORDER, SEND-OUT TO REF LAB; TAT 18-24 HRS)    EKG   Radiology No results found.  Procedures Procedures (including critical care time)  Medications Ordered in UC Medications - No data to display  Initial Impression / Assessment and Plan / UC Course  I have reviewed the triage vital signs and the nursing notes.  Pertinent labs & imaging results that were available during my care of the patient were reviewed by me and considered in my medical decision making (see chart for details).    Viral URI with cough, recent exposure to COVID, obtained COVID test which is pending. Benzonatate Perles prescribed for cough and recommended over-the-counter treatments.  Patient is in no significant distress and  appears well.  Did provide a work note for 2 days to allow time for COVID test result.  Return precautions given. Final Clinical Impressions(s) / UC Diagnoses   Final diagnoses:  Encounter for laboratory testing for COVID-19 virus  Viral URI with cough     Discharge Instructions      Your COVID test will result within 24 hours.  Your results will be available to view on MyChart.  Someone from our office will contact you if your results are positive.  Continue to hydrate well with fluids.  For cough upset over this imitate pearls take as directed.  If fever develops take Tylenol or ibuprofen as indicated on the label.  Developed severe shortness of breath or chest tightness return here for evaluation or go to the nearest emergency department.     ED Prescriptions     Medication Sig Dispense Auth. Provider   benzonatate (TESSALON) 100 MG capsule Take 1-2 capsules (100-200 mg total) by mouth 3 (three) times daily as needed for cough. 30 capsule Bing Neighbors, NP      PDMP not reviewed this encounter.   Bing Neighbors, NP 02/27/23 [phone]

## 2023-02-27 NOTE — ED Triage Notes (Signed)
Pt present cough with fatigue, symptom started two days ago. Pt state girlfriend tested positive for covid a few days ago.

## 2023-02-27 NOTE — Discharge Instructions (Addendum)
Your COVID test will result within 24 hours.  Your results will be available to view on MyChart.  Someone from our office will contact you if your results are positive.  Continue to hydrate well with fluids.  For cough upset over this imitate pearls take as directed.  If fever develops take Tylenol or ibuprofen as indicated on the label.  Developed severe shortness of breath or chest tightness return here for evaluation or go to the nearest emergency department.

## 2023-02-27 NOTE — Telephone Encounter (Signed)
Reordered COVID test under the correct order number.

## 2023-02-28 LAB — SARS CORONAVIRUS 2 (TAT 6-24 HRS): SARS Coronavirus 2: NEGATIVE

## 2023-04-30 ENCOUNTER — Ambulatory Visit (HOSPITAL_COMMUNITY)
Admission: EM | Admit: 2023-04-30 | Discharge: 2023-04-30 | Disposition: A | Discharge status: Home / Self Care | Payer: No Typology Code available for payment source | Attending: Emergency Medicine | Admitting: Emergency Medicine

## 2023-04-30 ENCOUNTER — Encounter (HOSPITAL_COMMUNITY): Payer: Self-pay

## 2023-04-30 DIAGNOSIS — H109 Unspecified conjunctivitis: Secondary | ICD-10-CM

## 2023-04-30 DIAGNOSIS — B9689 Other specified bacterial agents as the cause of diseases classified elsewhere: Secondary | ICD-10-CM | POA: Diagnosis not present

## 2023-04-30 MED ORDER — POLYMYXIN B-TRIMETHOPRIM 10000-0.1 UNIT/ML-% OP SOLN: 1.0000 [drp] | OPHTHALMIC | 0 refills | Status: AC

## 2023-04-30 NOTE — ED Triage Notes (Signed)
Patient reports that he has had redness of both eyes, L>R. Patient also reports eye drainage from both. Patient denies light sensitivity.  Patient denies taking any medications for his eyes.

## 2023-04-30 NOTE — ED Provider Notes (Signed)
[facility]    CSN: [id number] Arrival date & time: 04/30/23  1636      History   Chief Complaint No chief complaint on file.   HPI Lee Castro is a 22 y.o. male.   Patient presents for evaluation of bilateral eye redness, drainage and pruritus present for 3 days.  Left eye worse than right.  Has attempted use of a cool rag which did provide some comfort but symptoms have persisted.  Denies injury or trauma.  Denies pain, light sensitivity or visual changes.  No known sick contact.  Denies use of contacts.      History reviewed. No pertinent past medical history.  Patient Active Problem List   Diagnosis Date Noted   Closed fracture of left distal radius 11/26/2017    History reviewed. No pertinent surgical history.     Home Medications    Prior to Admission medications   Medication Sig Start Date End Date Taking? Authorizing Provider  benzonatate (TESSALON) 100 MG capsule Take 1-2 capsules (100-200 mg total) by mouth 3 (three) times daily as needed for cough. 02/27/23   Bing Neighbors, NP  ondansetron (ZOFRAN-ODT) 4 MG disintegrating tablet Take 1 tablet (4 mg total) by mouth every 8 (eight) hours as needed for nausea or vomiting. 02/13/23   Carlisle Beers, FNP    Family History Family History  Problem Relation Age of Onset   Healthy Mother    Healthy Father     Social History Social History   Tobacco Use   Smoking status: Never   Smokeless tobacco: Never  Vaping Use   Vaping status: Never Used  Substance Use Topics   Alcohol use: No   Drug use: Yes    Comment: occasionally     Allergies   Patient has no known allergies.   Review of Systems Review of Systems   Physical Exam Triage Vital Signs ED Triage Vitals  Encounter Vitals Group     BP 04/30/23 1709 (!) 143/76     Systolic BP Percentile --      Diastolic BP Percentile --      Pulse Rate 04/30/23 1709 79     Resp 04/30/23 1709 14     Temp 04/30/23 1709 98.9 F  (37.2 C)     Temp Source 04/30/23 1709 Oral     SpO2 04/30/23 1709 98 %     Weight --      Height --      Head Circumference --      Peak Flow --      Pain Score 04/30/23 1710 7     Pain Loc --      Pain Education --      Exclude from Growth Chart --    No data found.  Updated Vital Signs BP (!) 143/76 (BP Location: Right Arm)   Pulse 79   Temp 98.9 F (37.2 C) (Oral)   Resp 14   SpO2 98%   Visual Acuity Right Eye Distance:   Left Eye Distance:   Bilateral Distance:    Right Eye Near:   Left Eye Near:    Bilateral Near:     Physical Exam Constitutional:      Appearance: Normal appearance.  Eyes:     Comments: Significant erythema to the left conjunctiva, edema present to the right conjunctiva, no drainage noted on exam, vision is grossly intact, extraocular movements intact  Pulmonary:     Effort: Pulmonary effort is normal.  Skin:    General: Skin is warm and dry.  Neurological:     Mental Status: He is alert and oriented to person, place, and time. Mental status is at baseline.      UC Treatments / Results  Labs (all labs ordered are listed, but only abnormal results are displayed) Labs Reviewed - No data to display  EKG   Radiology No results found.  Procedures Procedures (including critical care time)  Medications Ordered in UC Medications - No data to display  Initial Impression / Assessment and Plan / UC Course  I have reviewed the triage vital signs and the nursing notes.  Pertinent labs & imaging results that were available during my care of the patient were reviewed by me and considered in my medical decision making (see chart for details).  Bacterial conjunctivitis of both eyes  Presentation and symptomology is consistent with conjunctivitis, discussed with patient, Polytrim prescribed and discussed administration, advised against eye touching or rubbing, cold and warm compresses and antihistamines for supportive care, advised to  follow-up as persist or worsen Final Clinical Impressions(s) / UC Diagnoses   Final diagnoses:  None   Discharge Instructions   None    ED Prescriptions   None    PDMP not reviewed this encounter.   Valinda Hoar, NP 04/30/23 1728

## 2023-04-30 NOTE — Discharge Instructions (Signed)
Today you being treated for bacterial conjunctivitis.   Place one drop of polytrim into the effected eye every 4 hours while awake for 7 days. If the other eye starts to have symptoms you may use medication in it as well. Do not allow tip of dropper to touch eye.  May use cool compress for comfort and to remove discharge if present. Pat the eye, do not wipe.  Do not rub eyes, this may cause more irritation.  May use benadryl as needed to help if itching present.  If symptoms persist after use of medication, please follow up at Urgent Care or with ophthalmologist (eye doctor)
